# Patient Record
Sex: Female | Born: 1985 | Race: Black or African American | Hispanic: No | Marital: Single | Smoking: Former smoker
Health system: Southern US, Community
[De-identification: ages and names within clinical notes are randomized; demographics above are authoritative.]

## PROBLEM LIST (undated history)

## (undated) DIAGNOSIS — T7840XA Allergy, unspecified, initial encounter: Secondary | ICD-10-CM

## (undated) DIAGNOSIS — K639 Disease of intestine, unspecified: Secondary | ICD-10-CM

## (undated) HISTORY — PX: COLON SURGERY: SHX602

## (undated) HISTORY — DX: Allergy, unspecified, initial encounter: T78.40XA

---
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
## 2007-11-22 ENCOUNTER — Emergency Department (HOSPITAL_COMMUNITY): Admission: EM | Admit: 2007-11-22 | Discharge: 2007-11-22 | Payer: Self-pay | Admitting: Emergency Medicine

## 2008-04-10 ENCOUNTER — Emergency Department (HOSPITAL_COMMUNITY): Admission: EM | Admit: 2008-04-10 | Discharge: 2008-04-10 | Payer: Self-pay | Admitting: Emergency Medicine

## 2008-06-05 ENCOUNTER — Emergency Department (HOSPITAL_COMMUNITY): Admission: EM | Admit: 2008-06-05 | Discharge: 2008-06-05 | Payer: Self-pay | Admitting: Emergency Medicine

## 2008-08-06 ENCOUNTER — Emergency Department (HOSPITAL_COMMUNITY): Admission: EM | Admit: 2008-08-06 | Discharge: 2008-08-06 | Payer: Self-pay | Admitting: Emergency Medicine

## 2009-08-26 ENCOUNTER — Emergency Department (HOSPITAL_COMMUNITY): Admission: EM | Admit: 2009-08-26 | Discharge: 2009-08-26 | Payer: Self-pay | Admitting: Emergency Medicine

## 2009-09-25 ENCOUNTER — Emergency Department (HOSPITAL_COMMUNITY): Admission: EM | Admit: 2009-09-25 | Discharge: 2009-09-25 | Payer: Self-pay | Admitting: Emergency Medicine

## 2009-10-19 ENCOUNTER — Emergency Department (HOSPITAL_COMMUNITY): Admission: EM | Admit: 2009-10-19 | Discharge: 2009-10-20 | Payer: Self-pay | Admitting: Emergency Medicine

## 2010-09-11 LAB — URINALYSIS, ROUTINE W REFLEX MICROSCOPIC
Bilirubin Urine: NEGATIVE
Glucose, UA: NEGATIVE mg/dL
Ketones, ur: 15 mg/dL — AB
Leukocytes, UA: NEGATIVE
Nitrite: NEGATIVE
Protein, ur: NEGATIVE mg/dL
Specific Gravity, Urine: 1.028 (ref 1.005–1.030)
Urobilinogen, UA: 4 mg/dL — ABNORMAL HIGH (ref 0.0–1.0)
pH: 6 (ref 5.0–8.0)

## 2010-09-11 LAB — POCT PREGNANCY, URINE: Preg Test, Ur: NEGATIVE

## 2010-09-11 LAB — URINE MICROSCOPIC-ADD ON

## 2010-09-12 LAB — DIFFERENTIAL
Basophils Absolute: 0 10*3/uL (ref 0.0–0.1)
Basophils Relative: 0 % (ref 0–1)
Eosinophils Relative: 2 % (ref 0–5)
Monocytes Absolute: 0.5 10*3/uL (ref 0.1–1.0)

## 2010-09-12 LAB — URINALYSIS, ROUTINE W REFLEX MICROSCOPIC
Bilirubin Urine: NEGATIVE
Ketones, ur: NEGATIVE mg/dL
Protein, ur: NEGATIVE mg/dL
Urobilinogen, UA: 2 mg/dL — ABNORMAL HIGH (ref 0.0–1.0)

## 2010-09-12 LAB — CBC
HCT: 38.4 % (ref 36.0–46.0)
MCV: 80.8 fL (ref 78.0–100.0)
Platelets: 181 10*3/uL (ref 150–400)
RBC: 4.76 MIL/uL (ref 3.87–5.11)
WBC: 4.6 10*3/uL (ref 4.0–10.5)

## 2010-09-12 LAB — COMPREHENSIVE METABOLIC PANEL
AST: 16 U/L (ref 0–37)
Albumin: 3.8 g/dL (ref 3.5–5.2)
Alkaline Phosphatase: 48 U/L (ref 39–117)
BUN: 9 mg/dL (ref 6–23)
CO2: 23 mEq/L (ref 19–32)
Chloride: 109 mEq/L (ref 96–112)
GFR calc Af Amer: >60 mL/min (ref >60)
GFR calc non Af Amer: >60 mL/min (ref >60)
Potassium: 3.5 mEq/L (ref 3.5–5.1)
Total Bilirubin: 0.4 mg/dL (ref 0.3–1.2)

## 2010-09-12 LAB — LIPASE, BLOOD: Lipase: 24 U/L (ref 11–59)

## 2010-09-12 LAB — PREGNANCY, URINE: Preg Test, Ur: NEGATIVE

## 2010-09-26 ENCOUNTER — Emergency Department (HOSPITAL_COMMUNITY): Payer: Worker's Compensation

## 2010-09-26 ENCOUNTER — Emergency Department (HOSPITAL_COMMUNITY)
Admission: EM | Admit: 2010-09-26 | Discharge: 2010-09-26 | Disposition: A | Discharge status: Home / Self Care | Payer: Worker's Compensation | Attending: Emergency Medicine | Admitting: Emergency Medicine

## 2010-09-26 DIAGNOSIS — Y9269 Other specified industrial and construction area as the place of occurrence of the external cause: Secondary | ICD-10-CM | POA: Insufficient documentation

## 2010-09-26 DIAGNOSIS — Y990 Civilian activity done for income or pay: Secondary | ICD-10-CM | POA: Insufficient documentation

## 2010-09-26 DIAGNOSIS — W240XXA Contact with lifting devices, not elsewhere classified, initial encounter: Secondary | ICD-10-CM | POA: Insufficient documentation

## 2010-09-26 DIAGNOSIS — S9030XA Contusion of unspecified foot, initial encounter: Secondary | ICD-10-CM | POA: Insufficient documentation

## 2010-09-26 DIAGNOSIS — M79609 Pain in unspecified limb: Secondary | ICD-10-CM | POA: Insufficient documentation

## 2011-03-20 LAB — COMPREHENSIVE METABOLIC PANEL
ALT: 16
AST: 14
Calcium: 9.2
Creatinine, Ser: 0.78
GFR calc Af Amer: >60
GFR calc non Af Amer: >60
Sodium: 138
Total Protein: 7.2

## 2011-03-20 LAB — CBC
MCHC: 32.5
MCV: 81.4
RDW: 13.3
WBC: 7.5

## 2011-03-20 LAB — DIFFERENTIAL
Eosinophils Absolute: 0.2
Lymphocytes Relative: 23
Lymphs Abs: 1.7
Monocytes Relative: 7
Neutrophils Relative %: 67

## 2011-03-20 LAB — URINE MICROSCOPIC-ADD ON

## 2011-03-20 LAB — URINALYSIS, ROUTINE W REFLEX MICROSCOPIC
Glucose, UA: NEGATIVE
Ketones, ur: NEGATIVE
Leukocytes, UA: NEGATIVE
Nitrite: POSITIVE — AB
Specific Gravity, Urine: 1.022
pH: 7

## 2011-03-20 LAB — LIPASE, BLOOD: Lipase: 20

## 2011-04-07 IMAGING — CT CT ABD-PELV W/O CM
2 of 4 series · 12 of 36 positions shown, 18 images · non-contrast
Comparison: None

CLINICAL DATA: Bilateral flank pain radiating to left leg,
hematuria, back pain

CT ABDOMEN AND PELVIS WITHOUT CONTRAST
TECHNIQUE: Multidetector CT imaging of the abdomen and pelvis was
performed following the standard protocol without intravenous
contrast.

[Series 2: stone <(id) >(id) · axial · 0.84mm/px · z∈[-411,-46]mm · 11 of 87 slices shown, 16 images]
[im 7/87  soft-tissue]
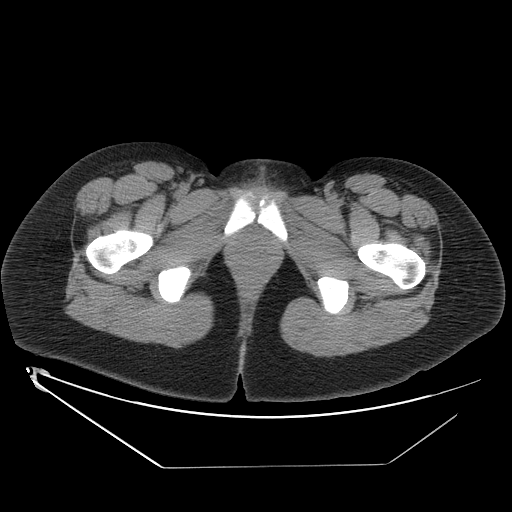
[im 7/87  bone]
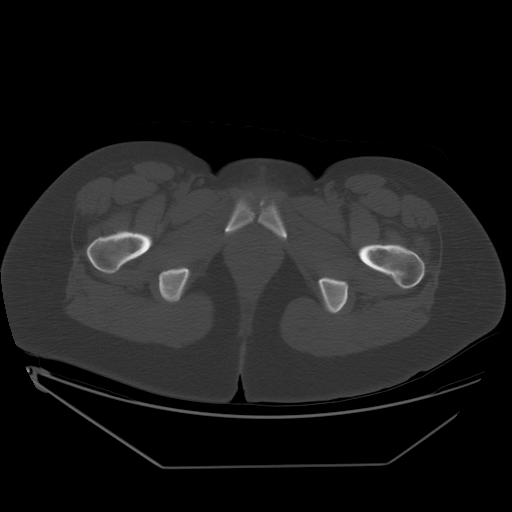
[im 14/87  soft-tissue]
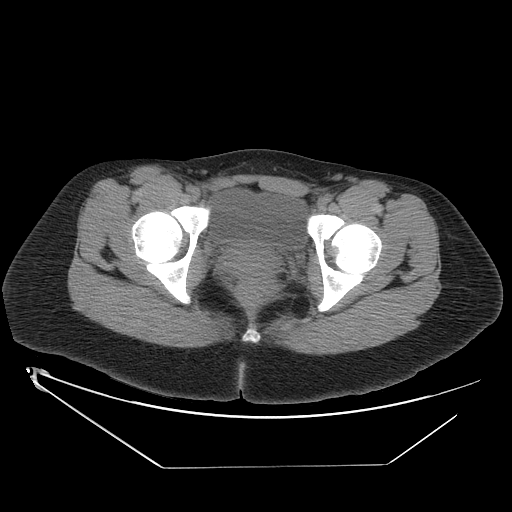
[im 27/87  soft-tissue]
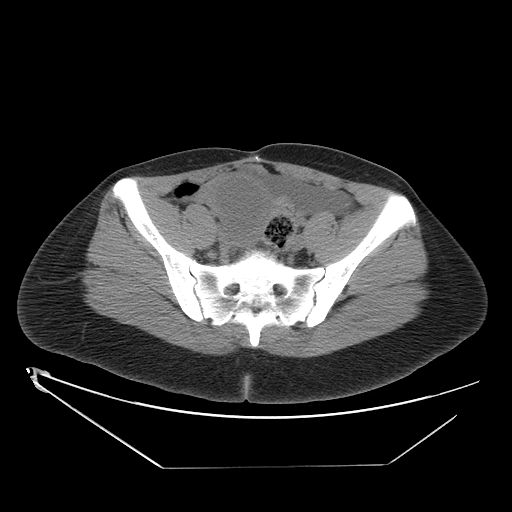
[im 34/87  soft-tissue]
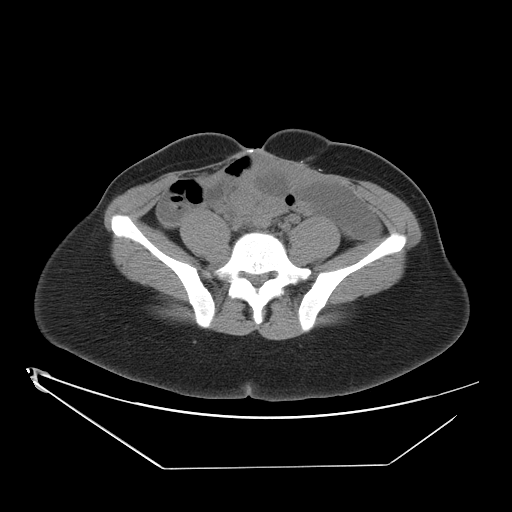
[im 40/87  soft-tissue]
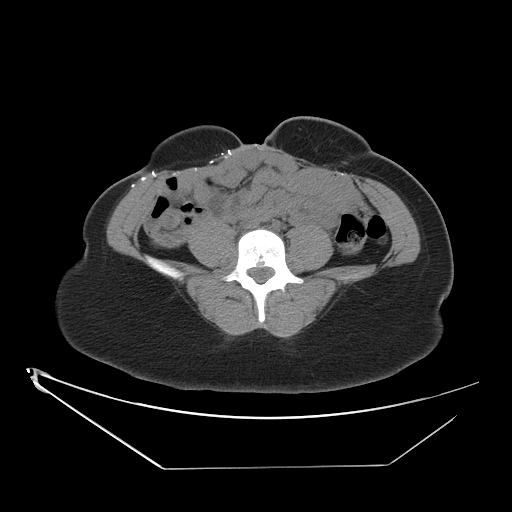
[im 47/87  soft-tissue]
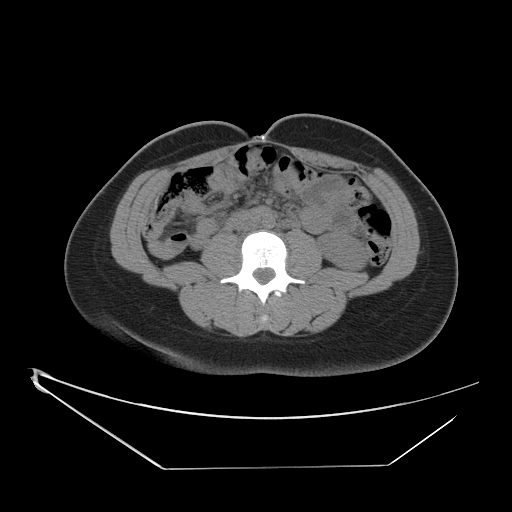
[im 53/87  soft-tissue]
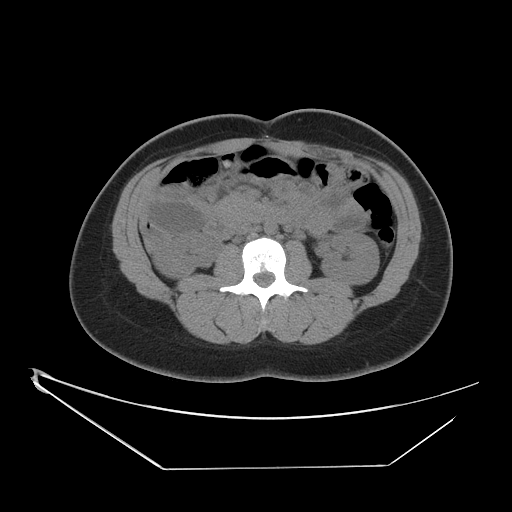
[im 60/87  lung]
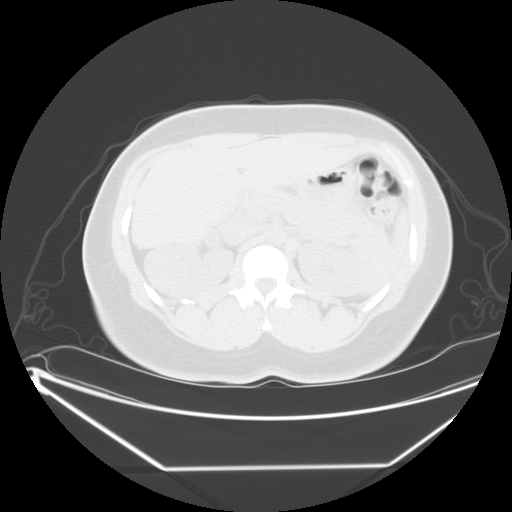
[im 67/87  soft-tissue]
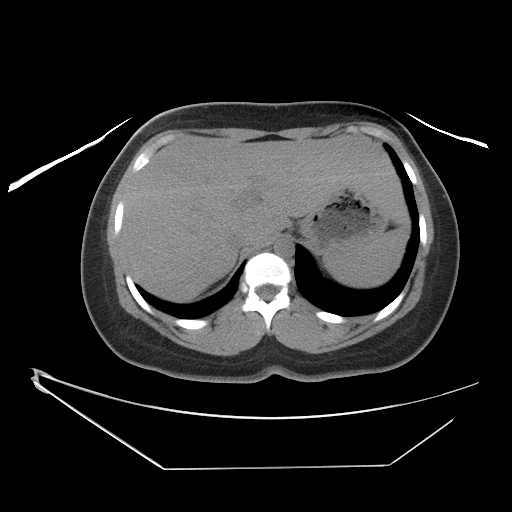
[im 67/87  lung]
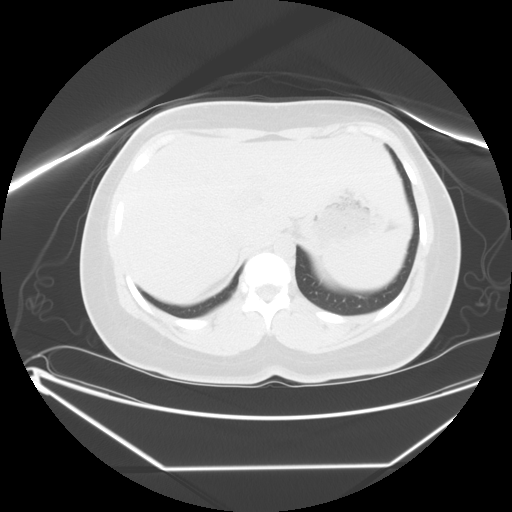
[im 73/87  soft-tissue]
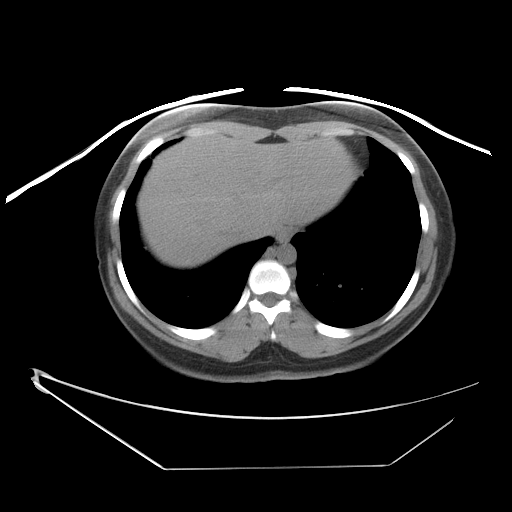
[im 73/87  lung]
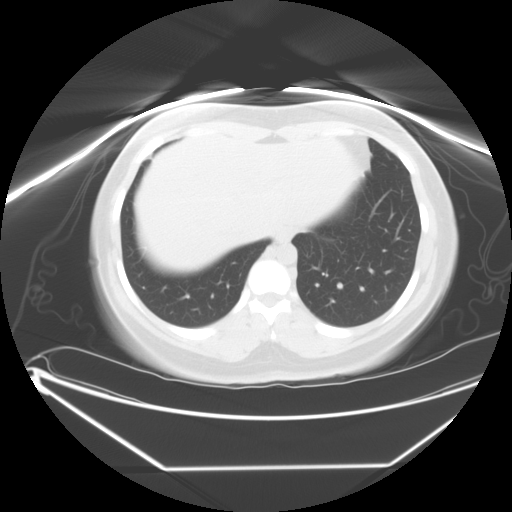
[im 73/87  bone]
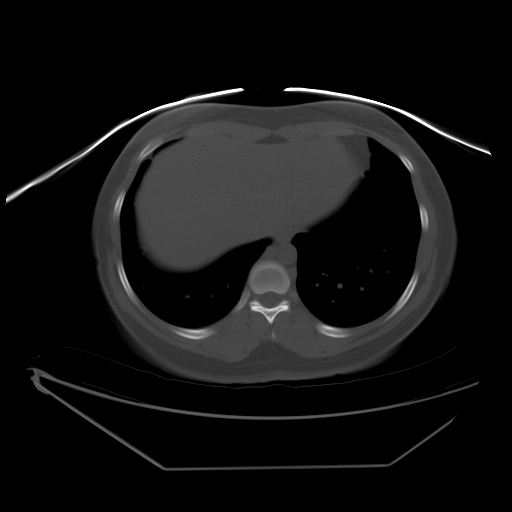
[im 80/87  soft-tissue]
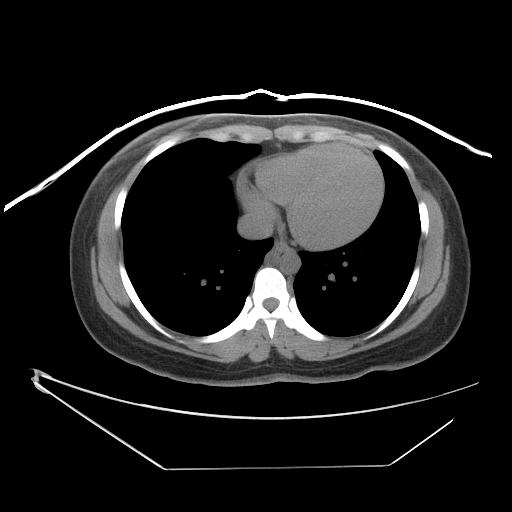
[im 80/87  lung]
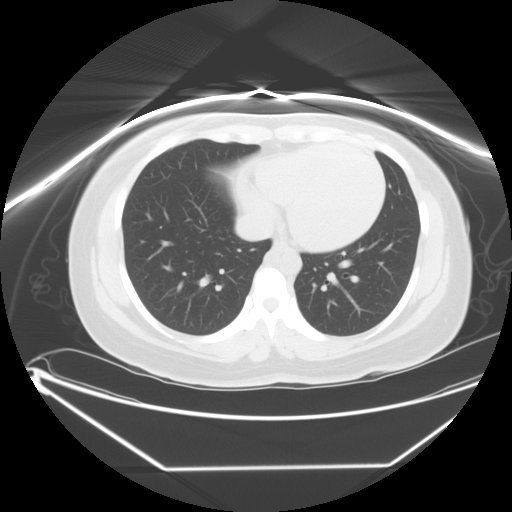

[Series 400: sag · coronal · 0.87mm/px · 1 of 129 slices shown, 2 images]
[im 43/129  soft-tissue]
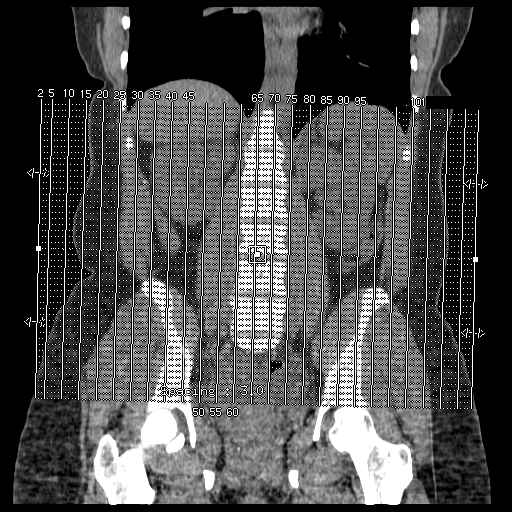
[im 43/129  bone]
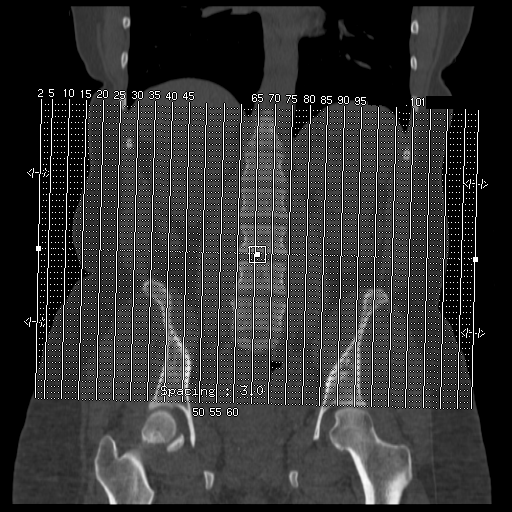

[12 of 36 positions shown; findings below may reference images not displayed]

FINDINGS: Lung bases clear.
No renal calcification, hydronephrosis, or ureteral dilatation.
Within limits of a nonenhanced exam, no focal abnormalities of the
liver, spleen, pancreas, kidneys, or adrenal glands.
Stomach unremarkable.
Surgical clips at anterior abdominal wall bilaterally.
Stool rectosigmoid colon.
Heterogeneous cystic mass identified in right pelvis, measuring
approximately 5.5 x 4.9 x 7.1 cm in size, either representing a
right ovarian/adnexal mass or an exophytic uterine tumor.
Additionally, a large fluid collection is identified in the left
pelvis, superior to the urinary bladder, extending cranially from
the left adnexal region, measuring up to 11.4 cm transverse, 5.5 cm
AP, and 8.9 cm length.
This is of uncertain etiology and origin, question left ovarian
cystic mass, marked hydrosalpinx, or of non gynecologic origin such
as a duplication cyst or mesenteric cyst.
The cystic lesion on the left is adjacent to urinary bladder but is
not felt to represent a bladder diverticulum.
Bladder is otherwise unremarkable, without evidence of distal
ureteral calcification.
Remaining bowel loops unremarkable.
No acute bony findings.
IMPRESSION: Left pelvic cystic lesion 11.4 x 5.5 x 8.9 cm.
Additional complicated cystic or degenerated solid lesion in the
right pelvis, [DATE] x 4.9 x 7.1 cm.
It is uncertain whether these represent ovarian origins,
potentially exophytic fibroid on the right, or non gynecologic
cystic lesions particular on the left such as GI duplication cyst
or mesenteric cyst.
Recommend follow-up characterization of these lesions by MR the
pelvis with and without contrast.
No evidence of urinary tract calcification or obstruction.
Uncertain prior abdominal surgery.

## 2011-06-29 ENCOUNTER — Emergency Department (HOSPITAL_COMMUNITY)
Admission: EM | Admit: 2011-06-29 | Discharge: 2011-06-29 | Disposition: A | Discharge status: Home / Self Care | Payer: Medicaid Other | Attending: Emergency Medicine | Admitting: Emergency Medicine

## 2011-06-29 DIAGNOSIS — R059 Cough, unspecified: Secondary | ICD-10-CM | POA: Insufficient documentation

## 2011-06-29 DIAGNOSIS — R05 Cough: Secondary | ICD-10-CM

## 2011-06-29 DIAGNOSIS — J3489 Other specified disorders of nose and nasal sinuses: Secondary | ICD-10-CM | POA: Insufficient documentation

## 2011-06-29 MED ORDER — CETIRIZINE-PSEUDOEPHEDRINE ER 5-120 MG PO TB12: 1.0000 | ORAL_TABLET | Freq: Every day | ORAL | Status: AC

## 2011-06-29 MED ORDER — GUAIFENESIN 100 MG/5ML PO LIQD: 100.0000 mg | ORAL | Status: AC | PRN

## 2011-06-29 MED ORDER — KETOROLAC TROMETHAMINE 60 MG/2ML IM SOLN
60.0000 mg | Freq: Once | INTRAMUSCULAR | Status: AC
  Administered 2011-06-29: 60 mg via INTRAMUSCULAR
  Filled 2011-06-29: qty 2

## 2011-06-29 MED ORDER — TRAMADOL HCL 50 MG PO TABS: 50.0000 mg | ORAL_TABLET | Freq: Four times a day (QID) | ORAL | Status: AC | PRN

## 2011-06-29 NOTE — ED Notes (Signed)
Cough for 3 months.

## 2011-06-29 NOTE — ED Provider Notes (Signed)
History     CSN: [id number]  Arrival date & time 06/29/11  1222   First MD Initiated Contact with Patient 06/29/11 1528      Chief Complaint  Patient presents with  . Cough   HPI Patient presents to the emergency room with complaint of cough for the past three months. Dry cough. No shortness of breath or chest pain. No recent travel or surgeries. Does not smoke. Nasal congestion. Patient does not have any pain at this time. Patient denies any weakness or numbness. No neurofocal deficits. Patient denies any inspirational chest pain. Denies any hemoptysis. Patient has had some minimal back pain in the past, does not have any back pain at this time. No loss in bowel or bladder function. No urinary retention.   History reviewed. No pertinent past medical history.  History reviewed. No pertinent past surgical history.  History reviewed. No pertinent family history.  History  Substance Use Topics  . Smoking status: Former Games developer  . Smokeless tobacco: Not on file  . Alcohol Use: Yes    OB History    Grav Para Term Preterm Abortions TAB SAB Ect Mult Living                  Review of Systems  Constitutional: Negative for fever, chills, diaphoresis, appetite change, fatigue and unexpected weight change.  HENT: Positive for congestion. Negative for ear pain, nosebleeds, sore throat, drooling, mouth sores, trouble swallowing, neck pain, neck stiffness, voice change and tinnitus.   Eyes: Negative for photophobia and visual disturbance.  Respiratory: Positive for cough. Negative for chest tightness and shortness of breath.   Cardiovascular: Negative for chest pain, palpitations and leg swelling.  Gastrointestinal: Negative for nausea, vomiting, abdominal pain, diarrhea, constipation, blood in stool, abdominal distention and rectal pain.  Genitourinary: Negative for dysuria, flank pain, difficulty urinating and dyspareunia.  Musculoskeletal: Negative for back pain.  Skin: Negative for  rash.  Neurological: Negative for dizziness, tremors, seizures, facial asymmetry, speech difficulty, weakness and numbness.  All other systems reviewed and are negative.    Allergies  Review of patient's allergies indicates no known allergies.  Home Medications   Current Outpatient Rx  Name Route Sig Dispense Refill  . ACETAMINOPHEN 500 MG PO TABS Oral Take 1,000 mg by mouth every 6 (six) hours as needed. pain     . IBUPROFEN 200 MG PO TABS Oral Take 400 mg by mouth every 6 (six) hours as needed. pain       BP 106/67  Pulse 80  Temp(Src) 97.9 F (36.6 C) (Oral)  Resp 16  Ht 5\' 4"  (1.626 m)  Wt 185 lb (83.915 kg)  BMI 31.76 kg/m2  SpO2 100%  LMP 06/10/2011  Physical Exam  Nursing note and vitals reviewed. Constitutional: She is oriented to person, place, and time. She appears well-developed and well-nourished.  Non-toxic appearance. No distress.       Nontoxic appearing  HENT:  Head: Normocephalic and atraumatic.  Right Ear: External ear normal.  Left Ear: External ear normal.  Nose: Nose normal.  Mouth/Throat: Oropharynx is clear and moist. No oropharyngeal exudate.  Eyes: EOM are normal. Pupils are equal, round, and reactive to light. Right eye exhibits no discharge. Left eye exhibits no discharge.  Neck: Normal range of motion. Neck supple.  Cardiovascular: Normal rate, regular rhythm, normal heart sounds and intact distal pulses.  Exam reveals no gallop and no friction rub.   No murmur heard. Pulmonary/Chest: Effort normal and breath sounds normal.  No respiratory distress. She has no wheezes. She has no rales. She exhibits no tenderness.  Abdominal: Soft. Bowel sounds are normal. She exhibits no distension and no mass. There is no tenderness. There is no rebound and no guarding.       No pulsatile mass  Musculoskeletal: Normal range of motion. She exhibits no edema and no tenderness.       Negative straight leg test bilateral lower extremities. Baseline ROM, moves  extremities with no obvious new focal weakness. Full strength in both lower extremities 5/5. No weakness with extension of great toe, L5 nerve room is not impaired. S1 tested and not impaired with testing ankle jerk reflex, good plantar flexion.  Lymphadenopathy:    She has no cervical adenopathy.  Neurological: She is alert and oriented to person, place, and time. She has normal strength. She displays no atrophy. No cranial nerve deficit or sensory deficit. She exhibits normal muscle tone. She displays a negative Romberg sign. Gait normal. She displays no Babinski's sign on the right side. She displays no Babinski's sign on the left side.  Reflex Scores:      Patellar reflexes are 2+ on the right side and 2+ on the left side.      Achilles reflexes are 2+ on the right side and 2+ on the left side.       Advised patient of warning signs to return. Patient stated agreement and understanding.  Skin: Skin is warm and dry. No rash noted. She is not diaphoretic. No erythema. No pallor.  Psychiatric: She has a normal mood and affect. Her behavior is normal. Judgment and thought content normal.    ED Course  Procedures (including critical care time)  Patient seen and evaluated.  VSS reviewed. . Nursing notes reviewed. Patient does not present with bilateral leg pain and weakness, urinary retention with overflow incontinence, fecal incontinece, saddle anesthesia. No acute onset of back, flank or groint pain. No history of cancer, unexplained weight loss. No fevers. No injuries. No concern for caudal equina syndrome, no emergent imaging needed at this time.  Advised patient of warning signs to return. Patient stated agreement and understanding.   MDM  Cough, lung sounds clear. Not a smoker. Normal vs. Blood pressure 106/67, pulse 80, temperature 97.9 F (36.6 C), temperature source Oral, resp. rate 16, height 5\' 4"  (1.626 m), weight 185 lb (83.915 kg), last menstrual period 06/10/2011, SpO2 100.00%. No  need for imaging at this time. No chest pain. PERC negative. Advised patient of warning signs to return. Stated agreement and understanding.            Demetrius Charity, [place] 06/29/11 [phone]

## 2011-06-29 NOTE — ED Provider Notes (Signed)
Medical screening examination/treatment/procedure(s) were performed by non-physician practitioner and as supervising physician I was immediately available for consultation/collaboration.  Hurman Horn, MD 06/29/11 [phone]

## 2011-09-11 ENCOUNTER — Emergency Department (HOSPITAL_COMMUNITY)
Admission: EM | Admit: 2011-09-11 | Discharge: 2011-09-11 | Disposition: A | Discharge status: Home / Self Care | Payer: Self-pay | Attending: Emergency Medicine | Admitting: Emergency Medicine

## 2011-09-11 ENCOUNTER — Encounter (HOSPITAL_COMMUNITY): Payer: Self-pay | Admitting: Adult Health

## 2011-09-11 DIAGNOSIS — R109 Unspecified abdominal pain: Secondary | ICD-10-CM | POA: Insufficient documentation

## 2011-09-11 DIAGNOSIS — A599 Trichomoniasis, unspecified: Secondary | ICD-10-CM

## 2011-09-11 DIAGNOSIS — R110 Nausea: Secondary | ICD-10-CM | POA: Insufficient documentation

## 2011-09-11 DIAGNOSIS — M549 Dorsalgia, unspecified: Secondary | ICD-10-CM | POA: Insufficient documentation

## 2011-09-11 HISTORY — DX: Disease of intestine, unspecified: K63.9

## 2011-09-11 LAB — URINE MICROSCOPIC-ADD ON

## 2011-09-11 LAB — URINALYSIS, ROUTINE W REFLEX MICROSCOPIC
Bilirubin Urine: NEGATIVE
Glucose, UA: NEGATIVE mg/dL
Ketones, ur: NEGATIVE mg/dL
Protein, ur: NEGATIVE mg/dL
Urobilinogen, UA: 1 mg/dL (ref 0.0–1.0)

## 2011-09-11 LAB — PREGNANCY, URINE: Preg Test, Ur: NEGATIVE

## 2011-09-11 MED ORDER — METRONIDAZOLE 500 MG PO TABS
2000.0000 mg | ORAL_TABLET | Freq: Once | ORAL | Status: AC
  Administered 2011-09-11: 2000 mg via ORAL
  Filled 2011-09-11: qty 4

## 2011-09-11 MED ORDER — CEFTRIAXONE SODIUM 250 MG IJ SOLR
250.0000 mg | Freq: Once | INTRAMUSCULAR | Status: AC
  Administered 2011-09-11: 250 mg via INTRAMUSCULAR
  Filled 2011-09-11: qty 250

## 2011-09-11 MED ORDER — AZITHROMYCIN 250 MG PO TABS
1000.0000 mg | ORAL_TABLET | Freq: Once | ORAL | Status: AC
  Administered 2011-09-11: 1000 mg via ORAL
  Filled 2011-09-11: qty 4

## 2011-09-11 MED ORDER — ONDANSETRON 8 MG PO TBDP
8.0000 mg | ORAL_TABLET | Freq: Once | ORAL | Status: AC
  Administered 2011-09-11: 8 mg via ORAL
  Filled 2011-09-11: qty 1

## 2011-09-11 NOTE — Discharge Instructions (Signed)
You have been treated in the emergency department for an infection, possibly sexually transmitted. Results of your gonorrhea and chlamydia tests are pending and you will be notified if they are positive. It is very important to practice safe sex and use condoms when sexually active. If your results are positive you need to notify all sexual partners so they can be treated as well. The website [email or web address] can be used to send anonymous text messages or emails to alert sexual contacts. Follow up with your doctor, or OBGYN in regards to today's visit.    Trichomoniasis Trichomoniasis is an infection, caused by the Trichomonas organism, that affects both women and men. In women, the outer female genitalia and the vagina are affected. In men, the penis is mainly affected, but the prostate and other reproductive organs can also be involved. Trichomoniasis is a sexually transmitted disease (STD) and is most often passed to another person through sexual contact. The majority of people who get trichomoniasis do so from a sexual encounter and are also at risk for other STDs. CAUSES   Sexual intercourse with an infected partner.   It can be present in swimming pools or hot tubs.  SYMPTOMS   Abnormal gray-green frothy vaginal discharge in women.   Vaginal itching and irritation in women.   Itching and irritation of the area outside the vagina in women.   Penile discharge with or without pain in males.   Inflammation of the urethra (urethritis), causing painful urination.   Bleeding after sexual intercourse.  RELATED COMPLICATIONS  Pelvic inflammatory disease.   Infection of the uterus (endometritis).   Infertility.   Tubal (ectopic) pregnancy.   It can be associated with other STDs, including gonorrhea and chlamydia, hepatitis B, and HIV.  COMPLICATIONS DURING PREGNANCY  Early (premature) delivery.   Premature rupture of the membranes (PROM).   Low birth weight.  DIAGNOSIS    Visualization of Trichomonas under the microscope from the vagina discharge.   Ph of the vagina greater than 4.5, tested with a test tape.   Trich Rapid Test.   Culture of the organism, but this is not usually needed.   It may be found on a Pap test.   Having a "strawberry cervix,"which means the cervix looks very red like a strawberry.  TREATMENT   You may be given medication to fight the infection. Inform your caregiver if you could be or are pregnant. Some medications used to treat the infection should not be taken during pregnancy.   Over-the-counter medications or creams to decrease itching or irritation may be recommended.   Your sexual partner will need to be treated if infected.  HOME CARE INSTRUCTIONS   Take all medication prescribed by your caregiver.   Take over-the-counter medication for itching or irritation as directed by your caregiver.   Do not have sexual intercourse while you have the infection.   Do not douche or wear tampons.   Discuss your infection with your partner, as your partner may have acquired the infection from you. Or, your partner may have been the person who transmitted the infection to you.   Have your sex partner examined and treated if necessary.   Practice safe, informed, and protected sex.   See your caregiver for other STD testing.  SEEK MEDICAL CARE IF:   You still have symptoms after you finish the medication.   You have an oral temperature above 102 F (38.9 C).   You develop belly (abdominal) pain.   You have pain  when you urinate.   You have bleeding after sexual intercourse.   You develop a rash.   The medication makes you sick or makes you throw up (vomit).  Document Released: 12/04/2000 Document Revised: 05/30/2011 Document Reviewed: 12/30/2008 [facility] Patient Information 2012 [place], [place].  Gonorrhea and Chlamydia SYMPTOMS  In females, symptoms may go unnoticed. Symptoms that are more noticeable can  include:  Belly (abdominal) pain.  Painful intercourse.  Watery mucous-like discharge from the vagina.  Miscarriage.  Discomfort when urinating.  Inflammation of the rectum.  Abnormal gray-green frothy vaginal discharge  Vaginal itching and irritatio  Itching and irritation of the area outside the vagina.   Painful urination.  Bleeding after sexual intercourse.  In males, symptoms include:  Burning with urination.  Pain in the testicles.  Watery mucous-like discharge from the penis.  It can cause longstanding (chronic) pelvic pain after frequent infections.  TREATMENT  PID can cause women to not be able to have children (sterile) if left untreated or if half-treated.  It is important to finish ALL medications given to you.  This is a sexually transmitted infection. So you are also at risk for other sexually transmitted diseases, including HIV (AIDS), it is recommended that you get tested. HOME CARE INSTRUCTIONS  Warning: This infection is contagious. Do not have sex until treatment is completed. Follow up at your caregiver's office or the clinic to which you were referred. If your diagnosis (learning what is wrong) is confirmed by culture or some other method, your recent sexual contacts need treatment. Even if they are symptom free or have a negative culture or evaluation, they should be treated.  PREVENTION  Women should use sanitary pads instead of tampons for vaginal discharge.  Wipe front to back after using the toilet and avoid douching.   Practice safe sex, use condoms, have only one sex partner and be sure your sex partner is not having sex with others.  Ask your caregiver to test you for chlamydia at your regular checkups or sooner if you are having symptoms.  Ask for further information if you are pregnant.  SEEK IMMEDIATE MEDICAL CARE IF:  You develop an oral temperature above 102 F (38.9 C), not controlled by medications or lasting more than 2 days.  You develop an  increase in pain.  You develop any type of abnormal discharge.  You develop vaginal bleeding and it is not time for your period.  You develop painful intercourse.   Bacterial Vaginosis  Bacterial vaginosis (BV) is a vaginal infection where the normal balance of bacteria in the vagina is disrupted. This is not a sexually transmitted disease and your sexual partners do NOT need to be treated. CAUSES  The cause of BV is not fully understood. BV develops when there is an increase or imbalance of harmful bacteria.  Some activities or behaviors can upset the normal balance of bacteria in the vagina and put women at increased risk including:  Having a new sex partner or multiple sex partners.  Douching.  Using an intrauterine device (IUD) for contraception.  It is not clear what role sexual activity plays in the development of BV. However, women that have never had sexual intercourse are rarely infected with BV.  Women do not get BV from toilet seats, bedding, swimming pools or from touching objects around them.   SYMPTOMS  Grey vaginal discharge.  A fish-like odor with discharge, especially after sexual intercourse.  Itching or burning of the vagina and vulva.  Burning  or pain with urination.  Some women have no signs or symptoms at all.   TREATMENT  Sometimes BV will clear up without treatment.  BV may be treated with antibiotics.  BV can recur after treatment. If this happens, a second round of antibiotics will often be prescribed.  HOME CARE INSTRUCTIONS  Finish all medication as directed by your caregiver.  Do not have sex until treatment is completed.  Do NOT drink any alcoholic beverages while being treated  with Metronidazole (Flagyl). This will cause a severe reaction inducing vomiting.  RESOURCE GUIDE  Dental Problems  Patients with Medicaid: [facility] [phone] [address]                                           [phone] [address]  [employer] Phone:  [phone]                                                  Phone:  [phone]  If unable to pay or uninsured, contact:  [facility] or [facility]. to become qualified for the adult dental clinic.  Chronic Pain Problems Contact [facility]  [phone] Patients need to be referred by their primary care doctor.  Insufficient Money for Medicine Contact [employer] Way:  call "211" or [facility] 314-534-6008.  No Primary Care Doctor Call [facility]  904-380-5984 Other agencies that provide inexpensive medical care    [facility]  [phone]    [facility]  [phone]    [facility]  [phone]    [facility]  [phone]    [facility]  727-872-4863    [facility]  [phone]  Psychological Services [facility]  [phone] [facility]  [phone] [facility]   [phone] (emergency services [phone])  Substance Abuse Resources [facility]  [phone] [facility] [phone] [facility] [phone] Floydene Flock [phone] Residential & Outpatient Substance Abuse Program  719-714-0004  Abuse/Neglect [facility] Child Abuse Hotline [phone] [facility] Child Abuse Hotline 585-452-3682 (After Hours)  Emergency Shelter [facility] 5795599349  Maternity Homes [facility] at the [place] [facility] [place] [phone] [facility] [phone]  MRSA Hotline #:   320-832-6756    [facility] Resources  [facility]     [employer]                          [facility] Dept. [address] [place]                       [address]      [address]  Patrecia Pace  [facility] Phone:  [phone]                                   Phone:  [phone]                  Phone:  [phone]  [facility] Phone:  [phone]  [facility] Child Abuse Hotline 516 146 4088 [phone] (After Hours)

## 2011-09-11 NOTE — ED Notes (Signed)
Pt c/o nausea starting today, and flank pain for a week. Pt took Advil last night to relieve symptoms. Believes back pain may be work related.

## 2011-09-11 NOTE — ED Provider Notes (Signed)
History     CSN: [id number]  Arrival date & time 09/11/11  1745   First MD Initiated Contact with Patient 09/11/11 2004      Chief Complaint  Patient presents with  . Nausea  . Flank Pain    (Consider location/radiation/quality/duration/timing/severity/associated sxs/prior treatment) HPI Comments: Patient with a history of colon surgery presents emergency department with the chief complaint of back pain.  Onset was yesterday, there are no exacerbating or alleviating factors, symptoms are associated with nausea, urinary frequency & urgency, but not vomiting hematuria, abnormal vaginal discharge or dysuria.  Patient reports that she typically suffers from chronic back pain however this somehow feels different it is located bilaterally on her flanks.  The pain does not radiate.  Patient has no other complaints at this time. Pt denies loss control of bowel & bladder, saddle paresthesias, numbness, tingling, or extremity weakness, difficulty ambulating, IV drug use or steroid use.   The history is provided by the patient.    Past Medical History  Diagnosis Date  . Colon disorder     Past Surgical History  Procedure Date  . Colon surgery     History reviewed. No pertinent family history.  History  Substance Use Topics  . Smoking status: Former Games developer  . Smokeless tobacco: Not on file  . Alcohol Use: Yes    OB History    Grav Para Term Preterm Abortions TAB SAB Ect Mult Living                  Review of Systems  Constitutional: Negative for fever, chills and appetite change.  HENT: Negative for congestion.   Eyes: Negative for visual disturbance.  Respiratory: Negative for shortness of breath.   Cardiovascular: Negative for chest pain and leg swelling.  Gastrointestinal: Negative for abdominal pain.  Genitourinary: Positive for flank pain. Negative for dysuria, urgency, frequency, hematuria, vaginal bleeding, vaginal discharge, difficulty urinating, vaginal pain and  dyspareunia.  Musculoskeletal: Positive for back pain.  Neurological: Negative for dizziness, syncope, weakness, light-headedness, numbness and headaches.  Psychiatric/Behavioral: Negative for confusion.    Allergies  Review of patient's allergies indicates no known allergies.  Home Medications   Current Outpatient Rx  Name Route Sig Dispense Refill  . ACETAMINOPHEN 500 MG PO TABS Oral Take 1,000 mg by mouth every 6 (six) hours as needed. pain     . CETIRIZINE-PSEUDOEPHEDRINE ER 5-120 MG PO TB12 Oral Take 1 tablet by mouth daily. 15 tablet 0  . IBUPROFEN 200 MG PO TABS Oral Take 400 mg by mouth every 6 (six) hours as needed. pain     . ADULT MULTIVITAMIN W/MINERALS CH Oral Take 1 tablet by mouth daily.      BP 115/70  Pulse 88  Temp(Src) 98.1 F (36.7 C) (Oral)  Resp 20  Ht 5\' 4"  (1.626 m)  Wt 180 lb (81.647 kg)  BMI 30.90 kg/m2  SpO2 99%  LMP 09/04/2011  Physical Exam  Nursing note and vitals reviewed. Constitutional: She is oriented to person, place, and time. She appears well-developed and well-nourished. No distress.  HENT:  Head: Normocephalic and atraumatic.  Eyes: Conjunctivae and EOM are normal.  Neck: Normal range of motion.  Cardiovascular:       Distal pulses intact.  Pulmonary/Chest: Effort normal.  Abdominal:       Soft, nontender to palpation.  Large mid abdominal surgical scar present.  Genitourinary:       Exam performed by Jaci Carrel,  exam chaperoned Date: 09/11/2011 Pelvic  exam: normal external genitalia without evidence of trauma. VULVA: normal appearing vulva with no masses, tenderness or lesion. VAGINA: normal appearing vagina with normal color and discharge, no lesions. CERVIX: normal appearing cervix without lesions, cervical motion tenderness absent, cervical os closed with out purulent discharge; vaginal discharge - clear white, Wet prep and DNA probe for chlamydia and GC obtained.   ADNEXA: normal adnexa in size, nontender and no  masses UTERUS: uterus is normal size, shape, consistency and nontender.    Musculoskeletal: Normal range of motion.       No CVA tenderness.  Neurological: She is alert and oriented to person, place, and time.       Strength 5/5 bilaterally, intact sensation and patellar reflexes.  Skin: Skin is warm and dry. No rash noted. She is not diaphoretic.  Psychiatric: She has a normal mood and affect. Her behavior is normal.    ED Course  Procedures (including critical care time)  Labs Reviewed  URINALYSIS, ROUTINE W REFLEX MICROSCOPIC - Abnormal; Notable for the following:    APPearance CLOUDY (*)    Hgb urine dipstick SMALL (*)    All other components within normal limits  URINE MICROSCOPIC-ADD ON - Abnormal; Notable for the following:    Squamous Epithelial / LPF MANY (*)    All other components within normal limits  PREGNANCY, URINE  WET PREP, GENITAL  GC/CHLAMYDIA PROBE AMP, GENITAL   No results found.   1. UTI (lower urinary tract infection)       MDM  Trichomonas seen in UA. Patient to be discharged with instructions to follow up with OBGYN. Discussed importance of using protection when sexually active. Pt understands that they have GC/Chlamydia cultures pending and that they will need to inform all sexual partners if results return positive. Pt has been treated prophylacticly with azithromycin and rocephin due to pts history, pelvic exam, and wet prep with increased WBCs. Pt not concerning for PID because hemodynamically stable and no cervical motion tenderness on pelvic exam. Pt has also been treated with flagyl for Trichomonas  Pt has been advised to not drink alcohol while on this medication.          Jaci Carrel, [place] 09/11/11 2331

## 2011-09-11 NOTE — ED Notes (Signed)
While at work today pt became nauseated and c/o flank pain on right side for one week.

## 2011-09-27 NOTE — ED Provider Notes (Signed)
Medical screening examination/treatment/procedure(s) were performed by non-physician practitioner and as supervising physician I was immediately available for consultation/collaboration.  Raeford Razor, MD 09/27/11 [phone]

## 2012-08-20 ENCOUNTER — Encounter (HOSPITAL_COMMUNITY): Payer: Self-pay | Admitting: Emergency Medicine

## 2012-08-20 ENCOUNTER — Emergency Department (HOSPITAL_COMMUNITY)
Admission: EM | Admit: 2012-08-20 | Discharge: 2012-08-20 | Disposition: A | Discharge status: Home / Self Care | Payer: Self-pay | Attending: Emergency Medicine | Admitting: Emergency Medicine

## 2012-08-20 DIAGNOSIS — M549 Dorsalgia, unspecified: Secondary | ICD-10-CM | POA: Insufficient documentation

## 2012-08-20 DIAGNOSIS — R197 Diarrhea, unspecified: Secondary | ICD-10-CM | POA: Insufficient documentation

## 2012-08-20 DIAGNOSIS — Z87891 Personal history of nicotine dependence: Secondary | ICD-10-CM | POA: Insufficient documentation

## 2012-08-20 DIAGNOSIS — R1084 Generalized abdominal pain: Secondary | ICD-10-CM | POA: Insufficient documentation

## 2012-08-20 DIAGNOSIS — Z3202 Encounter for pregnancy test, result negative: Secondary | ICD-10-CM | POA: Insufficient documentation

## 2012-08-20 DIAGNOSIS — Z8719 Personal history of other diseases of the digestive system: Secondary | ICD-10-CM | POA: Insufficient documentation

## 2012-08-20 DIAGNOSIS — R109 Unspecified abdominal pain: Secondary | ICD-10-CM

## 2012-08-20 LAB — CBC WITH DIFFERENTIAL/PLATELET
Basophils Relative: 0 % (ref 0–1)
HCT: 37.4 % (ref 36.0–46.0)
Hemoglobin: 12.5 g/dL (ref 12.0–15.0)
Lymphocytes Relative: 9 % — ABNORMAL LOW (ref 12–46)
Lymphs Abs: 0.8 10*3/uL (ref 0.7–4.0)
MCHC: 33.4 g/dL (ref 30.0–36.0)
Monocytes Absolute: 0.4 10*3/uL (ref 0.1–1.0)
Monocytes Relative: 4 % (ref 3–12)
Neutro Abs: 8.3 10*3/uL — ABNORMAL HIGH (ref 1.7–7.7)
Neutrophils Relative %: 87 % — ABNORMAL HIGH (ref 43–77)
RBC: 4.68 MIL/uL (ref 3.87–5.11)

## 2012-08-20 LAB — POCT PREGNANCY, URINE: Preg Test, Ur: NEGATIVE

## 2012-08-20 LAB — COMPREHENSIVE METABOLIC PANEL
Albumin: 3.8 g/dL (ref 3.5–5.2)
Alkaline Phosphatase: 59 U/L (ref 39–117)
BUN: 9 mg/dL (ref 6–23)
CO2: 29 mEq/L (ref 19–32)
Chloride: 104 mEq/L (ref 96–112)
Creatinine, Ser: 0.79 mg/dL (ref 0.50–1.10)
GFR calc Af Amer: >90 mL/min (ref >90)
GFR calc non Af Amer: >90 mL/min (ref >90)
Glucose, Bld: 94 mg/dL (ref 70–99)
Potassium: 4.1 mEq/L (ref 3.5–5.1)
Total Bilirubin: 0.9 mg/dL (ref 0.3–1.2)

## 2012-08-20 LAB — URINALYSIS, ROUTINE W REFLEX MICROSCOPIC
Glucose, UA: NEGATIVE mg/dL
Ketones, ur: NEGATIVE mg/dL
Leukocytes, UA: NEGATIVE
Nitrite: NEGATIVE
Specific Gravity, Urine: 1.021 (ref 1.005–1.030)
pH: 7.5 (ref 5.0–8.0)

## 2012-08-20 LAB — LIPASE, BLOOD: Lipase: 22 U/L (ref 11–59)

## 2012-08-20 LAB — URINE MICROSCOPIC-ADD ON

## 2012-08-20 MED ORDER — OXYCODONE-ACETAMINOPHEN 5-325 MG PO TABS: 1.0000 | ORAL_TABLET | Freq: Four times a day (QID) | ORAL | Status: DC | PRN

## 2012-08-20 MED ORDER — OXYCODONE-ACETAMINOPHEN 5-325 MG PO TABS
1.0000 | ORAL_TABLET | Freq: Once | ORAL | Status: AC
  Administered 2012-08-20: 1 via ORAL
  Filled 2012-08-20: qty 1

## 2012-08-20 NOTE — ED Notes (Signed)
Pt c/o generalized abd pain onset last pm.  St's today she is also having pain in right flank area.  Nausea without vomiting.  Last BM today
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
## 2012-11-17 ENCOUNTER — Encounter (HOSPITAL_COMMUNITY): Payer: Self-pay | Admitting: Emergency Medicine

## 2012-11-17 ENCOUNTER — Emergency Department (HOSPITAL_COMMUNITY)
Admission: EM | Admit: 2012-11-17 | Discharge: 2012-11-17 | Disposition: A | Discharge status: Home / Self Care | Payer: No Typology Code available for payment source | Attending: Emergency Medicine | Admitting: Emergency Medicine

## 2012-11-17 DIAGNOSIS — M549 Dorsalgia, unspecified: Secondary | ICD-10-CM

## 2012-11-17 DIAGNOSIS — Z8719 Personal history of other diseases of the digestive system: Secondary | ICD-10-CM | POA: Insufficient documentation

## 2012-11-17 DIAGNOSIS — Z79899 Other long term (current) drug therapy: Secondary | ICD-10-CM | POA: Insufficient documentation

## 2012-11-17 DIAGNOSIS — IMO0002 Reserved for concepts with insufficient information to code with codable children: Secondary | ICD-10-CM | POA: Insufficient documentation

## 2012-11-17 DIAGNOSIS — Y92410 Unspecified street and highway as the place of occurrence of the external cause: Secondary | ICD-10-CM | POA: Insufficient documentation

## 2012-11-17 DIAGNOSIS — T148XXA Other injury of unspecified body region, initial encounter: Secondary | ICD-10-CM

## 2012-11-17 DIAGNOSIS — Z87891 Personal history of nicotine dependence: Secondary | ICD-10-CM | POA: Insufficient documentation

## 2012-11-17 DIAGNOSIS — Y939 Activity, unspecified: Secondary | ICD-10-CM | POA: Insufficient documentation

## 2012-11-17 MED ORDER — CYCLOBENZAPRINE HCL 10 MG PO TABS: 10.0000 mg | ORAL_TABLET | Freq: Two times a day (BID) | ORAL | Status: DC | PRN

## 2012-11-17 MED ORDER — IBUPROFEN 800 MG PO TABS: 800.0000 mg | ORAL_TABLET | Freq: Three times a day (TID) | ORAL | Status: DC
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
## 2012-11-22 NOTE — ED Provider Notes (Signed)
Medical screening examination/treatment/procedure(s) were performed by non-physician practitioner and as supervising physician I was immediately available for consultation/collaboration.   Glynn Octave, MD 11/22/12 [phone]

## 2013-11-27 ENCOUNTER — Emergency Department (HOSPITAL_COMMUNITY)
Admission: EM | Admit: 2013-11-27 | Discharge: 2013-11-27 | Disposition: A | Discharge status: Home / Self Care | Payer: No Typology Code available for payment source | Attending: Emergency Medicine | Admitting: Emergency Medicine

## 2013-11-27 ENCOUNTER — Encounter (HOSPITAL_COMMUNITY): Payer: Self-pay | Admitting: Emergency Medicine

## 2013-11-27 DIAGNOSIS — S139XXA Sprain of joints and ligaments of unspecified parts of neck, initial encounter: Secondary | ICD-10-CM | POA: Insufficient documentation

## 2013-11-27 DIAGNOSIS — Z87891 Personal history of nicotine dependence: Secondary | ICD-10-CM | POA: Insufficient documentation

## 2013-11-27 DIAGNOSIS — S161XXA Strain of muscle, fascia and tendon at neck level, initial encounter: Secondary | ICD-10-CM

## 2013-11-27 DIAGNOSIS — Y92410 Unspecified street and highway as the place of occurrence of the external cause: Secondary | ICD-10-CM | POA: Insufficient documentation

## 2013-11-27 DIAGNOSIS — Y9389 Activity, other specified: Secondary | ICD-10-CM | POA: Insufficient documentation

## 2013-11-27 DIAGNOSIS — Z8719 Personal history of other diseases of the digestive system: Secondary | ICD-10-CM | POA: Insufficient documentation

## 2013-11-27 DIAGNOSIS — Z79899 Other long term (current) drug therapy: Secondary | ICD-10-CM | POA: Insufficient documentation

## 2013-11-27 MED ORDER — CYCLOBENZAPRINE HCL 10 MG PO TABS: 10.0000 mg | ORAL_TABLET | Freq: Two times a day (BID) | ORAL | Status: DC | PRN
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
## 2013-11-27 NOTE — Discharge Instructions (Signed)
For pain control you may take up to 800mg of ibuprofen (that is usually 4 over the counter pills)  3 times a day (take with food) and acetaminophen 975mg (this is 3 over the counter pills) four times a day. Do not drink alcohol or combine with other medications that have acetaminophen as an ingredient (Read the labels!).  ° ° For breakthrough pain you may take Flexeril. Do not drink alcohol, drive or operate heavy machinery when taking Flexeril. ° °Do not hesitate to return to the Emergency Department for any new, worsening or concerning symptoms.  ° °If you do not have a primary care doctor you can establish one at the  ° °[facility]: °[address] °[place] [address] °[phone] ° °After you establish care. Let them know you were seen in the emergency room. They must obtain records for further management.  ° ° °
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
## 2013-11-28 NOTE — ED Provider Notes (Signed)
Medical screening examination/treatment/procedure(s) were performed by non-physician practitioner and as supervising physician I was immediately available for consultation/collaboration.   EKG Interpretation None        Cordarius Benning S [place] Seaborn, MD 11/28/13 0739 

## 2013-12-08 ENCOUNTER — Encounter (HOSPITAL_COMMUNITY): Payer: Self-pay | Admitting: Emergency Medicine

## 2013-12-08 ENCOUNTER — Emergency Department (HOSPITAL_COMMUNITY)
Admission: EM | Admit: 2013-12-08 | Discharge: 2013-12-08 | Disposition: A | Discharge status: Home / Self Care | Payer: No Typology Code available for payment source | Attending: Emergency Medicine | Admitting: Emergency Medicine

## 2013-12-08 DIAGNOSIS — Y92410 Unspecified street and highway as the place of occurrence of the external cause: Secondary | ICD-10-CM | POA: Insufficient documentation

## 2013-12-08 DIAGNOSIS — S0993XA Unspecified injury of face, initial encounter: Secondary | ICD-10-CM | POA: Insufficient documentation

## 2013-12-08 DIAGNOSIS — Z87891 Personal history of nicotine dependence: Secondary | ICD-10-CM | POA: Insufficient documentation

## 2013-12-08 DIAGNOSIS — Z79899 Other long term (current) drug therapy: Secondary | ICD-10-CM | POA: Insufficient documentation

## 2013-12-08 DIAGNOSIS — Z8719 Personal history of other diseases of the digestive system: Secondary | ICD-10-CM | POA: Insufficient documentation

## 2013-12-08 DIAGNOSIS — IMO0002 Reserved for concepts with insufficient information to code with codable children: Secondary | ICD-10-CM | POA: Insufficient documentation

## 2013-12-08 DIAGNOSIS — S199XXA Unspecified injury of neck, initial encounter: Secondary | ICD-10-CM

## 2013-12-08 DIAGNOSIS — Y9389 Activity, other specified: Secondary | ICD-10-CM | POA: Insufficient documentation

## 2013-12-08 MED ORDER — HYDROCODONE-ACETAMINOPHEN 5-325 MG PO TABS: 1.0000 | ORAL_TABLET | Freq: Four times a day (QID) | ORAL | Status: DC | PRN

## 2013-12-08 MED ORDER — IBUPROFEN 800 MG PO TABS: 800.0000 mg | ORAL_TABLET | Freq: Three times a day (TID) | ORAL | Status: DC
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
## 2013-12-08 NOTE — ED Notes (Signed)
PA at bedside.
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
## 2013-12-10 NOTE — ED Provider Notes (Signed)
Medical screening examination/treatment/procedure(s) were performed by non-physician practitioner and as supervising physician I was immediately available for consultation/collaboration.   EKG Interpretation None       Elliott L Wentz, MD 12/10/13 1702 

## 2016-04-03 ENCOUNTER — Ambulatory Visit (INDEPENDENT_AMBULATORY_CARE_PROVIDER_SITE_OTHER): Payer: BLUE CROSS/BLUE SHIELD | Admitting: Family Medicine

## 2016-04-03 VITALS — BP 110/72 | HR 77 | Temp 98.8°F | Resp 16 | Ht 62.0 in | Wt 208.0 lb

## 2016-04-03 DIAGNOSIS — J069 Acute upper respiratory infection, unspecified: Secondary | ICD-10-CM | POA: Diagnosis not present

## 2016-04-03 MED ORDER — AZELASTINE HCL 0.1 % NA SOLN: 2.0000 | Freq: Two times a day (BID) | NASAL | 1 refills | Status: DC

## 2016-04-03 MED ORDER — CETIRIZINE-PSEUDOEPHEDRINE ER 5-120 MG PO TB12: 1.0000 | ORAL_TABLET | Freq: Two times a day (BID) | ORAL | 1 refills | Status: DC

## 2016-04-03 NOTE — Progress Notes (Signed)
   SUBJECTIVE: URI symptoms:  Julia AsalJamie M Parsons is a 30 y.o. female who complains of URI symptoms present for past 2 days.  Describes rhinorrhea, sinus congestion, mild cough.  Has tried Tylenol with some relief.   Sick contacts are coworkers.  No fevers or chills. No nausea or vomiting.  Denies smoking cigarettes.   Past history is seasonal allergies.  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 No other hospitalizations or surgeries.  She does have family history of diabetes type 2 in her father. She does not use any cigarettes or illicit drug use. She states she has 1-2 alcoholic drinks a week.  OBJECTIVE: BP 110/72   Pulse 77   Temp 98.8 F (37.1 C) (Oral)   Resp 16   Ht 5\' 2"  (1.575 m)   Wt 208 lb (94.3 kg)   LMP 03/27/2016   SpO2 98%   BMI 38.04 kg/m  Gen:  Patient sitting on exam table, appears stated age in no acute distress Head: Normocephalic atraumatic Eyes: EOMI, PERRL, sclera and conjunctiva non-erythematous Ears:  Canals clear bilaterally.  TMs pearly gray bilaterally without erythema or bulging.   Nose:  Nasal turbinates grossly enlarged bilaterally. Some exudates noted. Tender to palpation of maxillary sinus  Mouth: Mucosa membranes moist. Tonsils +2, nonenlarged, non-erythematous.  Throat and pharynx normal appearing. Neck: No cervical lymphadenopathy noted Heart:  RRR, no murmurs auscultated. Pulm:  Clear to auscultation bilaterally with good air movement.  No wheezes or rales noted.     Impression/Plan: 1. URI:  Likely viral illness based on symptoms and history.  No signs of bacterial illness. Symptomatic treatment for now, see instructions.   Return if worsening or no improvement in 1 week.

## 2016-04-03 NOTE — Patient Instructions (Addendum)
Use the cetirizine pseudoephedrine once daily to help as a decongestant for your head cold.  Use the nasal spray 2 sprays twice daily to help open up her sinuses and her nose.  You do have a head cold. They should run its course in the next 4-5 days.  If you start having fevers and chills, worsening symptoms, not getting better in the next week or so come back and see us.  It was good to meet you today.    IF you received an x-ray today, you will receive an invoice from [facility]. Please contact [facility] at [phone] with questions or concerns regarding your invoice.   IF you received labwork today, you will receive an invoice from [facility]. Please contact [facility] at [phone] with questions or concerns regarding your invoice.   Our billing staff will not be able to assist you with questions regarding bills from these companies.  You will be contacted with the lab results as soon as they are available. The fastest way to get your results is to activate your My Chart account. Instructions are located on the last page of this paperwork. If you have not heard from us regarding the results in 2 weeks, please contact this office.

## 2016-04-10 ENCOUNTER — Encounter: Payer: Self-pay | Admitting: *Deleted

## 2019-12-08 ENCOUNTER — Ambulatory Visit (INDEPENDENT_AMBULATORY_CARE_PROVIDER_SITE_OTHER): Payer: BC Managed Care – PPO | Admitting: Physician Assistant

## 2019-12-08 ENCOUNTER — Encounter: Payer: Self-pay | Admitting: Physician Assistant

## 2019-12-08 ENCOUNTER — Other Ambulatory Visit: Payer: Self-pay

## 2019-12-08 DIAGNOSIS — L700 Acne vulgaris: Secondary | ICD-10-CM | POA: Diagnosis not present

## 2019-12-08 MED ORDER — DOXYCYCLINE HYCLATE 100 MG PO CAPS: 100.0000 mg | ORAL_CAPSULE | Freq: Two times a day (BID) | ORAL | 3 refills | Status: AC

## 2019-12-08 MED ORDER — EPIDUO FORTE 0.3-2.5 % EX GEL: 1.0000 "application " | Freq: Every evening | CUTANEOUS | 3 refills | Status: AC

## 2019-12-08 NOTE — Progress Notes (Signed)
X- doxy 100mg 

## 2019-12-08 NOTE — Progress Notes (Signed)
   Follow up Visit  Subjective  Julia Parsons is a 34 y.o. female who presents for the following: Acne (face only- breaking out since stop taking pill  doxy 100mg ). She was given Doxy in 2018. She has been out of the medication for awhile. She felt like this was a good combo.  She had also had epiduo. Recently she's been using vit e capsules, noxema. These are not really working. She doesn't have hair growth under her chin. Her neck itches where the breakouts are. 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    Objective  Well appearing patient in no apparent distress; mood and affect are within normal limits.  Face examined. Relevant physical exam findings are noted in the Assessment and Plan.   Objective  Head - Anterior (Face), Neck - Anterior: Erythematous papules and pustules with comedones   Assessment & Plan  Acne vulgaris (2) Head - Anterior (Face); Neck - Anterior  Discussed other options of oral contraceptive, spironolactone and isotretinoin.  Ordered Medications: doxycycline (VIBRAMYCIN) 100 MG capsule Adapalene-Benzoyl Peroxide (EPIDUO FORTE) 0.3-2.5 % GEL

## 2019-12-10 ENCOUNTER — Telehealth: Payer: Self-pay | Admitting: Physician Assistant

## 2019-12-10 NOTE — Telephone Encounter (Addendum)
Doxy was covered by her insurance, but the other med--she doesn't know name--was not.  [facility] on [address]. Her phone # is [phone]. Wants to know if prior auth was done or if she needs to be Rx'd a different med

## 2019-12-13 NOTE — Telephone Encounter (Signed)
Fax received from [employer] for patient's Epiduo Forte for a prior authorization.

## 2019-12-13 NOTE — Telephone Encounter (Signed)
Prior authorization done through [employer] Med[employer] for Epiduo Forte.   This request has been approved.  Please note any additional information provided by [employer] at the bottom of your screen.  [id number];[id number];Review Type:Prior Auth;Coverage Start Date:11/13/2019;Coverage End Date:12/12/2020;

## 2019-12-13 NOTE — Telephone Encounter (Signed)
Phone call to patient to inform her that the prior authorization for her Epiduo-Forte was done and approved.  Patient aware.

## 2020-01-24 ENCOUNTER — Ambulatory Visit (INDEPENDENT_AMBULATORY_CARE_PROVIDER_SITE_OTHER): Payer: BC Managed Care – PPO | Admitting: Physician Assistant

## 2020-01-24 ENCOUNTER — Other Ambulatory Visit: Payer: Self-pay

## 2020-01-24 ENCOUNTER — Encounter: Payer: Self-pay | Admitting: Physician Assistant

## 2020-01-24 DIAGNOSIS — L700 Acne vulgaris: Secondary | ICD-10-CM | POA: Diagnosis not present

## 2020-01-24 MED ORDER — DOXYCYCLINE HYCLATE 100 MG PO CAPS: 100.0000 mg | ORAL_CAPSULE | Freq: Two times a day (BID) | ORAL | 2 refills | Status: AC

## 2020-01-24 NOTE — Progress Notes (Signed)
   Follow up Visit  Subjective  Julia Parsons is a 34 y.o. female who presents for the following: Acne (still having breakouts, currently taking birth control, topical cream at night, drinking more water). She is on OC, taking Doxy bid and using epiduo forte at night.   Objective  Well appearing patient in no apparent distress; mood and affect are within normal limits.  Face examined. Relevant physical exam findings are noted in the Assessment and Plan.   Objective  Head - Anterior (Face): Erythematous papules and pustules with comedones less overall new bumps. Hyperpigmentation is improving  Assessment & Plan  Acne vulgaris Head - Anterior (Face)  Continue Doxy, OC and epiduo forte. We will recheck in November. Around that time we will D/C the antibiotic and see if the OC can work on its own with the Epiduo forte. If not we will consider doing Accutane.   doxycycline (VIBRAMYCIN) 100 MG capsule - Head - Anterior (Face)  Other Related Medications Adapalene-Benzoyl Peroxide (EPIDUO FORTE) 0.3-2.5 % GEL

## 2020-01-31 ENCOUNTER — Telehealth: Payer: Self-pay | Admitting: Physician Assistant

## 2020-01-31 NOTE — Telephone Encounter (Signed)
Cream JCB Rx'd is causing open sores + burning + peeling.

## 2020-01-31 NOTE — Telephone Encounter (Signed)
I just saw her 1 week ago and she was doing great is she sure that epiduo forte is doing it or did she change cleansers. If not stop epiduo forte. Use gentle cleansers and antibiotic ointment to any open areas.

## 2020-02-01 NOTE — Telephone Encounter (Signed)
Left message for patient to call back to discuss medication concern and possible treatment.

## 2020-02-03 ENCOUNTER — Telehealth: Payer: Self-pay | Admitting: Physician Assistant

## 2020-02-03 ENCOUNTER — Encounter: Payer: Self-pay | Admitting: Physician Assistant

## 2020-02-03 NOTE — Telephone Encounter (Signed)
Patient left message on office voice mail saying that she was returning a phone call from yesterday.

## 2020-02-03 NOTE — Telephone Encounter (Signed)
Spoke with patient regarding skin concern.  She stopped using the epiduo thinking it was the cause of the rash.  Patient is going to start back on the doxcycline and will monitor to see if that was the cause of the reaction, if not she will add the epiduo back.  If she has additional issues she is to send a Clinical cytogeneticist message with pictures and may possible need to be seen.  Patient agreeable.

## 2020-02-15 ENCOUNTER — Telehealth: Payer: Self-pay | Admitting: *Deleted

## 2020-02-15 NOTE — Telephone Encounter (Signed)
-----   Message from Shelly Flatten, [place] sent at 02/14/2020  5:25 PM EDT ----- See if she would like the cancellation on weds at 1:30

## 2020-02-15 NOTE — Telephone Encounter (Signed)
Patient is off next week so appointment made aug 30 @ 130; patient was unable to come this week.

## 2020-02-16 ENCOUNTER — Ambulatory Visit: Payer: BC Managed Care – PPO | Admitting: Physician Assistant

## 2020-02-21 ENCOUNTER — Other Ambulatory Visit: Payer: Self-pay

## 2020-02-21 ENCOUNTER — Encounter: Payer: Self-pay | Admitting: Physician Assistant

## 2020-02-21 ENCOUNTER — Ambulatory Visit (INDEPENDENT_AMBULATORY_CARE_PROVIDER_SITE_OTHER): Payer: BC Managed Care – PPO | Admitting: Physician Assistant

## 2020-02-21 DIAGNOSIS — L700 Acne vulgaris: Secondary | ICD-10-CM | POA: Diagnosis not present

## 2020-02-21 NOTE — Progress Notes (Signed)
   Follow up Visit  Subjective  Julia Parsons is a 34 y.o. female who presents for the following: Follow-up (had sent a photo to [place] of face. Had a breakout. She is using vitamin e oil and washing face with luke warm water. ). She was clear at last visit but then within a few days had a reflare with pustules and scabbed bumps. She did not itch or swell or whelp and so it did not seem like an allergic reaction.  Objective  Well appearing patient in no apparent distress; mood and affect are within normal limits.  Face examined. Relevant physical exam findings are noted in the Assessment and Plan.   Objective  Head - Anterior (Face): Patient add flare up/breakout.  Ipledge brochure given to patient.   Assessment & Plan  Acne vulgaris Head - Anterior (Face)  Told pt that this flare up may have been a random event that may not occur again and she may get back to only using the topical. We did discuss the use of accutane if she reflares again or for more of a long term option.  Other Related Medications Adapalene-Benzoyl Peroxide (EPIDUO FORTE) 0.3-2.5 % GEL doxycycline (VIBRAMYCIN) 100 MG capsule

## 2020-03-20 ENCOUNTER — Ambulatory Visit: Payer: BC Managed Care – PPO | Admitting: Physician Assistant

## 2021-06-12 ENCOUNTER — Encounter: Payer: Self-pay | Admitting: Dermatology

## 2021-06-12 ENCOUNTER — Ambulatory Visit (INDEPENDENT_AMBULATORY_CARE_PROVIDER_SITE_OTHER): Payer: BC Managed Care – PPO | Admitting: Dermatology

## 2021-06-12 ENCOUNTER — Other Ambulatory Visit: Payer: Self-pay

## 2021-06-12 DIAGNOSIS — L700 Acne vulgaris: Secondary | ICD-10-CM

## 2021-06-12 MED ORDER — MINOCYCLINE HCL 50 MG PO CAPS: ORAL_CAPSULE | ORAL | 3 refills | Status: AC

## 2021-06-12 MED ORDER — DAPSONE 5 % EX GEL: CUTANEOUS | 3 refills | Status: AC

## 2021-07-08 ENCOUNTER — Encounter: Payer: Self-pay | Admitting: Dermatology

## 2021-07-08 NOTE — Progress Notes (Signed)
° °  Follow-Up Visit   Subjective  Julia Parsons is a 36 y.o. female who presents for the following: Acne (Face- keep breaking out face tx- epiduo & doxy- quit taking because it made my face worse- tx- witch hazel & seabreeze).  Acne, still with active breakouts Location:  Duration:  Quality:  Associated Signs/Symptoms: Modifying Factors:  Severity:  Timing: Context:   Objective  Well appearing patient in no apparent distress; mood and affect are within normal limits. Head - Anterior (Face) Scattered comedones, inflammatory papules, and pustules.  Postinflammatory hyperpigmentation.    A focused examination was performed including head and neck. Relevant physical exam findings are noted in the Assessment and Plan.   Assessment & Plan    Acne vulgaris Head - Anterior (Face)  Tx plans and alternatives discussed, topicals vs orals vs hormone therapy vs isotretinoin  For 6-8 wks, pt will be using minocycline 50 twice daily  Dapsone topical AM/PM.  Discontinue oral doxycycline.   Related Medications Adapalene-Benzoyl Peroxide (EPIDUO FORTE) 0.3-2.5 % GEL Apply 1 application topically Nightly.  doxycycline (VIBRAMYCIN) 100 MG capsule Take 1 capsule (100 mg total) by mouth in the morning and at bedtime.  minocycline (MINOCIN) 50 MG capsule TAKE ONE TABLET TWICE DAILY  Dapsone (ACZONE) 5 % topical gel APPLY A PEA SIZED DOT TO THE FACE MORNING AND NIGHT      I, Janalyn Harder, MD, have reviewed all documentation for this visit.  The documentation on 07/08/21 for the exam, diagnosis, procedures, and orders are all accurate and complete.

## 2021-07-24 ENCOUNTER — Encounter: Payer: Self-pay | Admitting: Dermatology

## 2021-07-24 ENCOUNTER — Other Ambulatory Visit: Payer: Self-pay

## 2021-07-24 ENCOUNTER — Ambulatory Visit (INDEPENDENT_AMBULATORY_CARE_PROVIDER_SITE_OTHER): Payer: BC Managed Care – PPO | Admitting: Dermatology

## 2021-07-24 DIAGNOSIS — L700 Acne vulgaris: Secondary | ICD-10-CM

## 2021-07-24 MED ORDER — ARAZLO 0.045 % EX LOTN: TOPICAL_LOTION | CUTANEOUS | 5 refills | Status: AC

## 2021-08-17 ENCOUNTER — Encounter: Payer: Self-pay | Admitting: Dermatology

## 2021-08-17 NOTE — Progress Notes (Signed)
° °  Follow-Up Visit   Subjective  Julia Parsons is a 36 y.o. female who presents for the following: Acne (F/u for acne- better- tx- doxy & aczone gel).  Acne, improving Location:  Duration:  Quality:  Associated Signs/Symptoms: Modifying Factors:  Severity:  Timing: Context:   Objective  Well appearing patient in no apparent distress; mood and affect are within normal limits. Head Deep inflammatory acne improved, still with superficial lesions and some PIH    A focused examination was performed including head and neck. Relevant physical exam findings are noted in the Assessment and Plan.   Assessment & Plan    Acne vulgaris Head  Arazlo 2-3 nights weekly, continue oral minocycline try tapering from 2-1 daily.  Next follow-up by MyChart or telephone 2 months.  Related Medications Adapalene-Benzoyl Peroxide (EPIDUO FORTE) 0.3-2.5 % GEL Apply 1 application topically Nightly.  doxycycline (VIBRAMYCIN) 100 MG capsule Take 1 capsule (100 mg total) by mouth in the morning and at bedtime.  minocycline (MINOCIN) 50 MG capsule TAKE ONE TABLET TWICE DAILY  Dapsone (ACZONE) 5 % topical gel APPLY A PEA SIZED DOT TO THE FACE MORNING AND NIGHT  Tazarotene (ARAZLO) 0.045 % LOTN Apply to affected area qhs      I, Janalyn Harder, MD, have reviewed all documentation for this visit.  The documentation on 08/17/21 for the exam, diagnosis, procedures, and orders are all accurate and complete.

## 2021-09-17 ENCOUNTER — Ambulatory Visit: Payer: BC Managed Care – PPO | Admitting: Dermatology

## 2021-10-22 ENCOUNTER — Ambulatory Visit: Payer: BC Managed Care – PPO | Admitting: Dermatology

## 2021-11-15 ENCOUNTER — Encounter (HOSPITAL_COMMUNITY): Payer: Self-pay

## 2021-11-15 ENCOUNTER — Emergency Department (HOSPITAL_COMMUNITY)
Admission: EM | Admit: 2021-11-15 | Discharge: 2021-11-15 | Disposition: A | Discharge status: Home / Self Care | Payer: BC Managed Care – PPO | Attending: Emergency Medicine | Admitting: Emergency Medicine

## 2021-11-15 ENCOUNTER — Other Ambulatory Visit: Payer: Self-pay

## 2021-11-15 DIAGNOSIS — M545 Low back pain, unspecified: Secondary | ICD-10-CM | POA: Diagnosis present

## 2021-11-15 DIAGNOSIS — R0789 Other chest pain: Secondary | ICD-10-CM | POA: Diagnosis not present

## 2021-11-15 DIAGNOSIS — Y92410 Unspecified street and highway as the place of occurrence of the external cause: Secondary | ICD-10-CM | POA: Diagnosis not present

## 2021-11-15 DIAGNOSIS — M546 Pain in thoracic spine: Secondary | ICD-10-CM | POA: Diagnosis not present

## 2021-11-15 MED ORDER — CELECOXIB 200 MG PO CAPS: 200.0000 mg | ORAL_CAPSULE | Freq: Two times a day (BID) | ORAL | 0 refills | Status: AC

## 2021-11-15 MED ORDER — CYCLOBENZAPRINE HCL 10 MG PO TABS: 5.0000 mg | ORAL_TABLET | Freq: Two times a day (BID) | ORAL | 0 refills | Status: AC | PRN
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
## 2021-11-15 NOTE — Discharge Instructions (Signed)
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
## 2022-03-23 ENCOUNTER — Emergency Department (HOSPITAL_COMMUNITY): Payer: BC Managed Care – PPO

## 2022-03-23 ENCOUNTER — Emergency Department (HOSPITAL_COMMUNITY)
Admission: EM | Admit: 2022-03-23 | Discharge: 2022-03-23 | Disposition: A | Discharge status: Home / Self Care | Payer: BC Managed Care – PPO | Attending: Emergency Medicine | Admitting: Emergency Medicine

## 2022-03-23 ENCOUNTER — Encounter (HOSPITAL_COMMUNITY): Payer: Self-pay | Admitting: Emergency Medicine

## 2022-03-23 DIAGNOSIS — S80812A Abrasion, left lower leg, initial encounter: Secondary | ICD-10-CM | POA: Diagnosis present

## 2022-03-23 DIAGNOSIS — M25532 Pain in left wrist: Secondary | ICD-10-CM | POA: Diagnosis not present

## 2022-03-23 DIAGNOSIS — Z23 Encounter for immunization: Secondary | ICD-10-CM | POA: Diagnosis not present

## 2022-03-23 DIAGNOSIS — Y92410 Unspecified street and highway as the place of occurrence of the external cause: Secondary | ICD-10-CM | POA: Insufficient documentation

## 2022-03-23 DIAGNOSIS — M25562 Pain in left knee: Secondary | ICD-10-CM | POA: Diagnosis not present

## 2022-03-23 DIAGNOSIS — S8012XA Contusion of left lower leg, initial encounter: Secondary | ICD-10-CM | POA: Diagnosis not present

## 2022-03-23 MED ORDER — IBUPROFEN 200 MG PO TABS
600.0000 mg | ORAL_TABLET | Freq: Once | ORAL | Status: AC
Start: 2022-03-23 — End: 2022-03-23
  Administered 2022-03-23: 600 mg via ORAL
  Filled 2022-03-23: qty 3

## 2022-03-23 MED ORDER — TETANUS-DIPHTH-ACELL PERTUSSIS 5-2.5-18.5 LF-MCG/0.5 IM SUSY
0.5000 mL | PREFILLED_SYRINGE | Freq: Once | INTRAMUSCULAR | Status: AC
  Administered 2022-03-23: 0.5 mL via INTRAMUSCULAR
  Filled 2022-03-23: qty 0.5
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
## 2024-07-29 ENCOUNTER — Other Ambulatory Visit: Payer: Self-pay | Admitting: Medical Genetics
